# Patient Record
Sex: Female | Born: 1960 | Race: White | Hispanic: No | Marital: Married | Smoking: Current every day smoker
Health system: Southern US, Community
[De-identification: ages and names within clinical notes are randomized; demographics above are authoritative.]

## PROBLEM LIST (undated history)

## (undated) DIAGNOSIS — E7800 Pure hypercholesterolemia, unspecified: Secondary | ICD-10-CM

## (undated) DIAGNOSIS — F419 Anxiety disorder, unspecified: Secondary | ICD-10-CM

## (undated) DIAGNOSIS — I440 Atrioventricular block, first degree: Secondary | ICD-10-CM

## (undated) DIAGNOSIS — Z8619 Personal history of other infectious and parasitic diseases: Secondary | ICD-10-CM

## (undated) DIAGNOSIS — F331 Major depressive disorder, recurrent, moderate: Secondary | ICD-10-CM

## (undated) DIAGNOSIS — R002 Palpitations: Secondary | ICD-10-CM

## (undated) DIAGNOSIS — I471 Supraventricular tachycardia, unspecified: Secondary | ICD-10-CM

## (undated) DIAGNOSIS — R079 Chest pain, unspecified: Secondary | ICD-10-CM

## (undated) DIAGNOSIS — F909 Attention-deficit hyperactivity disorder, unspecified type: Secondary | ICD-10-CM

## (undated) DIAGNOSIS — I10 Essential (primary) hypertension: Secondary | ICD-10-CM

## (undated) DIAGNOSIS — R008 Other abnormalities of heart beat: Secondary | ICD-10-CM

## (undated) DIAGNOSIS — U071 COVID-19: Secondary | ICD-10-CM

## (undated) DIAGNOSIS — D518 Other vitamin B12 deficiency anemias: Secondary | ICD-10-CM

## (undated) HISTORY — DX: Other abnormalities of heart beat: R00.8

## (undated) HISTORY — DX: Palpitations: R00.2

## (undated) HISTORY — DX: Atrioventricular block, first degree: I44.0

## (undated) HISTORY — DX: Major depressive disorder, recurrent, moderate: F33.1

## (undated) HISTORY — DX: Personal history of other infectious and parasitic diseases: Z86.19

## (undated) HISTORY — DX: Attention-deficit hyperactivity disorder, unspecified type: F90.9

## (undated) HISTORY — DX: Other vitamin B12 deficiency anemias: D51.8

## (undated) HISTORY — DX: Essential (primary) hypertension: I10

## (undated) HISTORY — DX: Chest pain, unspecified: R07.9

## (undated) HISTORY — DX: Supraventricular tachycardia: I47.1

## (undated) HISTORY — PX: UPPER GASTROINTESTINAL ENDOSCOPY: SHX188

## (undated) HISTORY — DX: Pure hypercholesterolemia, unspecified: E78.00

## (undated) HISTORY — DX: Anxiety disorder, unspecified: F41.9

## (undated) HISTORY — DX: Supraventricular tachycardia, unspecified: I47.10

## (undated) HISTORY — DX: COVID-19: U07.1

---
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
## 2004-02-26 ENCOUNTER — Other Ambulatory Visit: Admission: RE | Admit: 2004-02-26 | Discharge: 2004-02-26 | Payer: Self-pay | Admitting: Gynecology

## 2004-02-26 ENCOUNTER — Ambulatory Visit (HOSPITAL_COMMUNITY): Admission: RE | Admit: 2004-02-26 | Discharge: 2004-02-26 | Payer: Self-pay | Admitting: Gynecology

## 2004-03-03 ENCOUNTER — Ambulatory Visit (HOSPITAL_COMMUNITY): Admission: RE | Admit: 2004-03-03 | Discharge: 2004-03-03 | Payer: Self-pay | Admitting: Gynecology

## 2004-10-09 IMAGING — CR DG CHEST 2V
2 series · 2 of 2 positions shown · non-contrast
Comparison: none

CLINICAL DATA: Smoking history of 20 years.  No prior exams.
PA AND LATERAL CHEST:
Heart and mediastinal contours are within normal limits.  The lung fields are notable for a 4 mm density identified between the 7th and 8th intercostal space.  A correlative abnormality is questionably seen posteriorly on the lateral film.  As the patient has no prior exams available for comparison and has a 20 year history of smoking, initial evaluation with limited thin section chest CT would be recommended.  The lung fields are otherwise clear. Bony structures appear intact.
IMPRESSION
4 mm nodular density seen in the right hemithorax and clearly identified in one view only.  Limited chest CT would be recommended for initial further assessment.  The lung fields are otherwise clear.  
Because of this finding, this report was called to Dr. Greenwald?[REDACTED].

[view not recorded (1 of 2)]
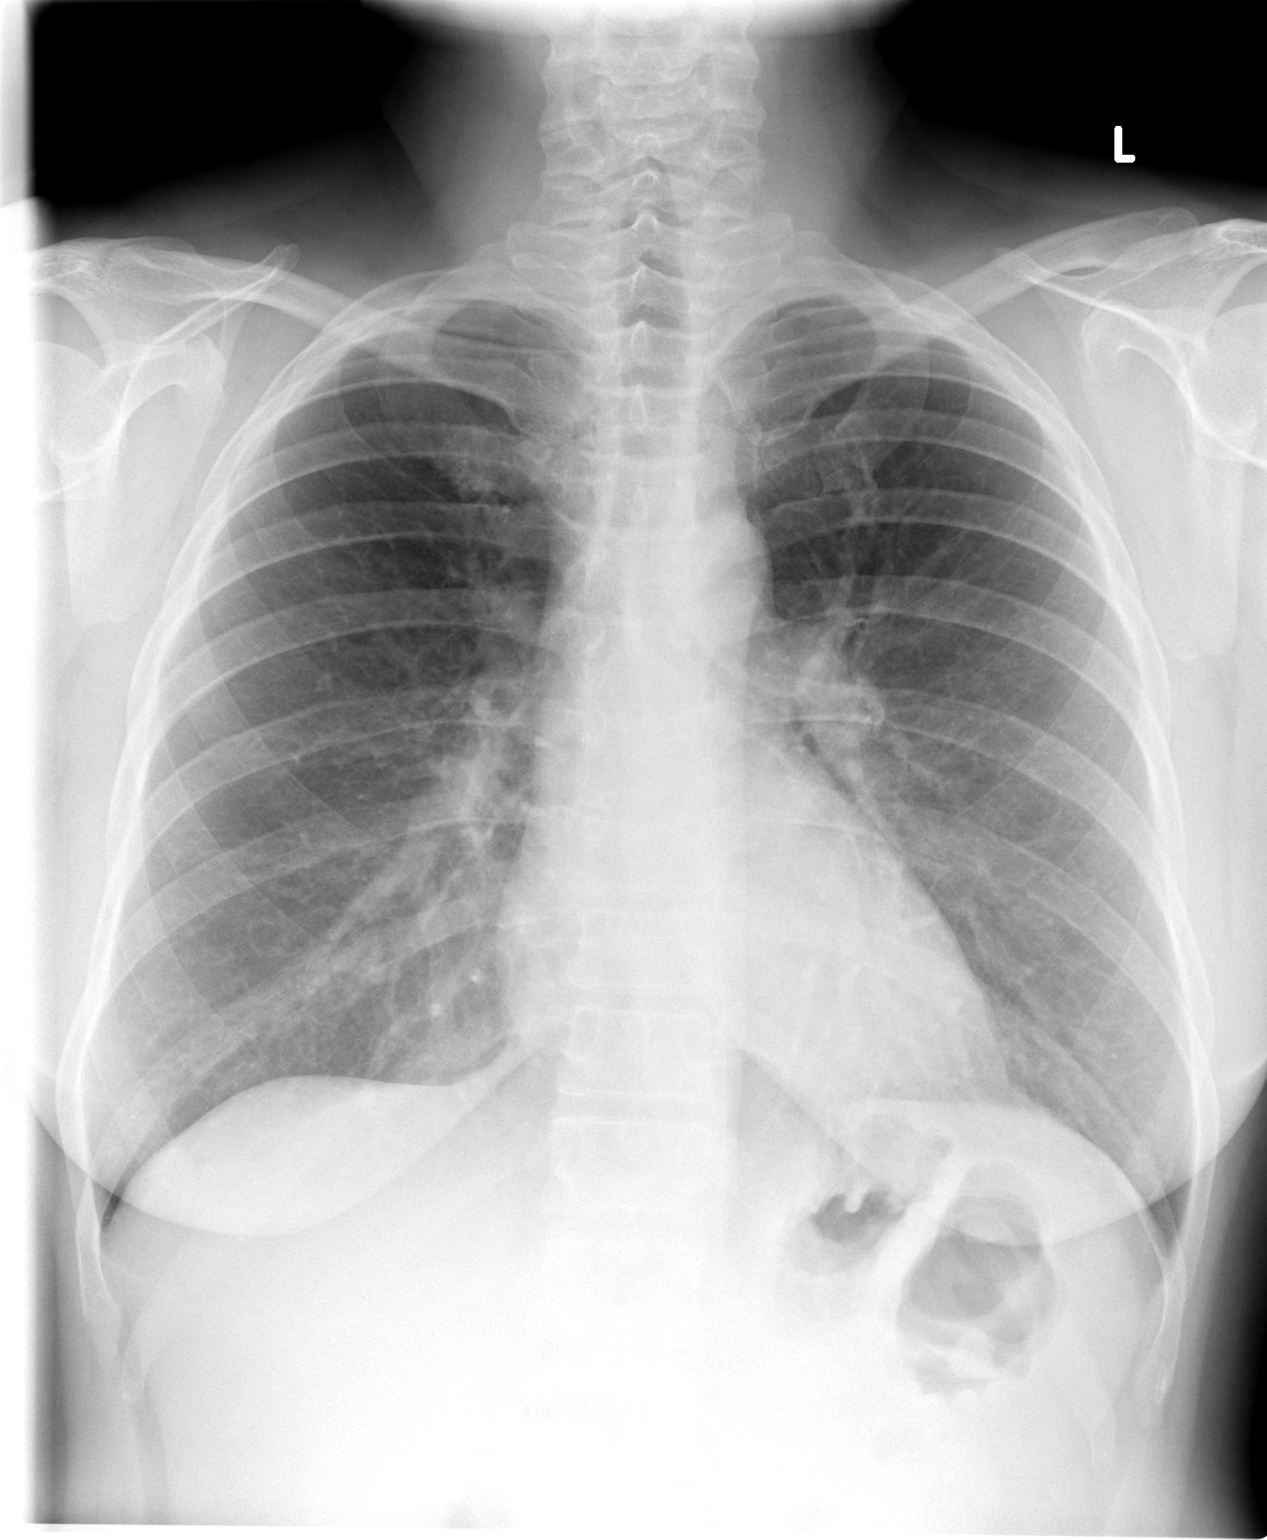

[view not recorded (2 of 2)]
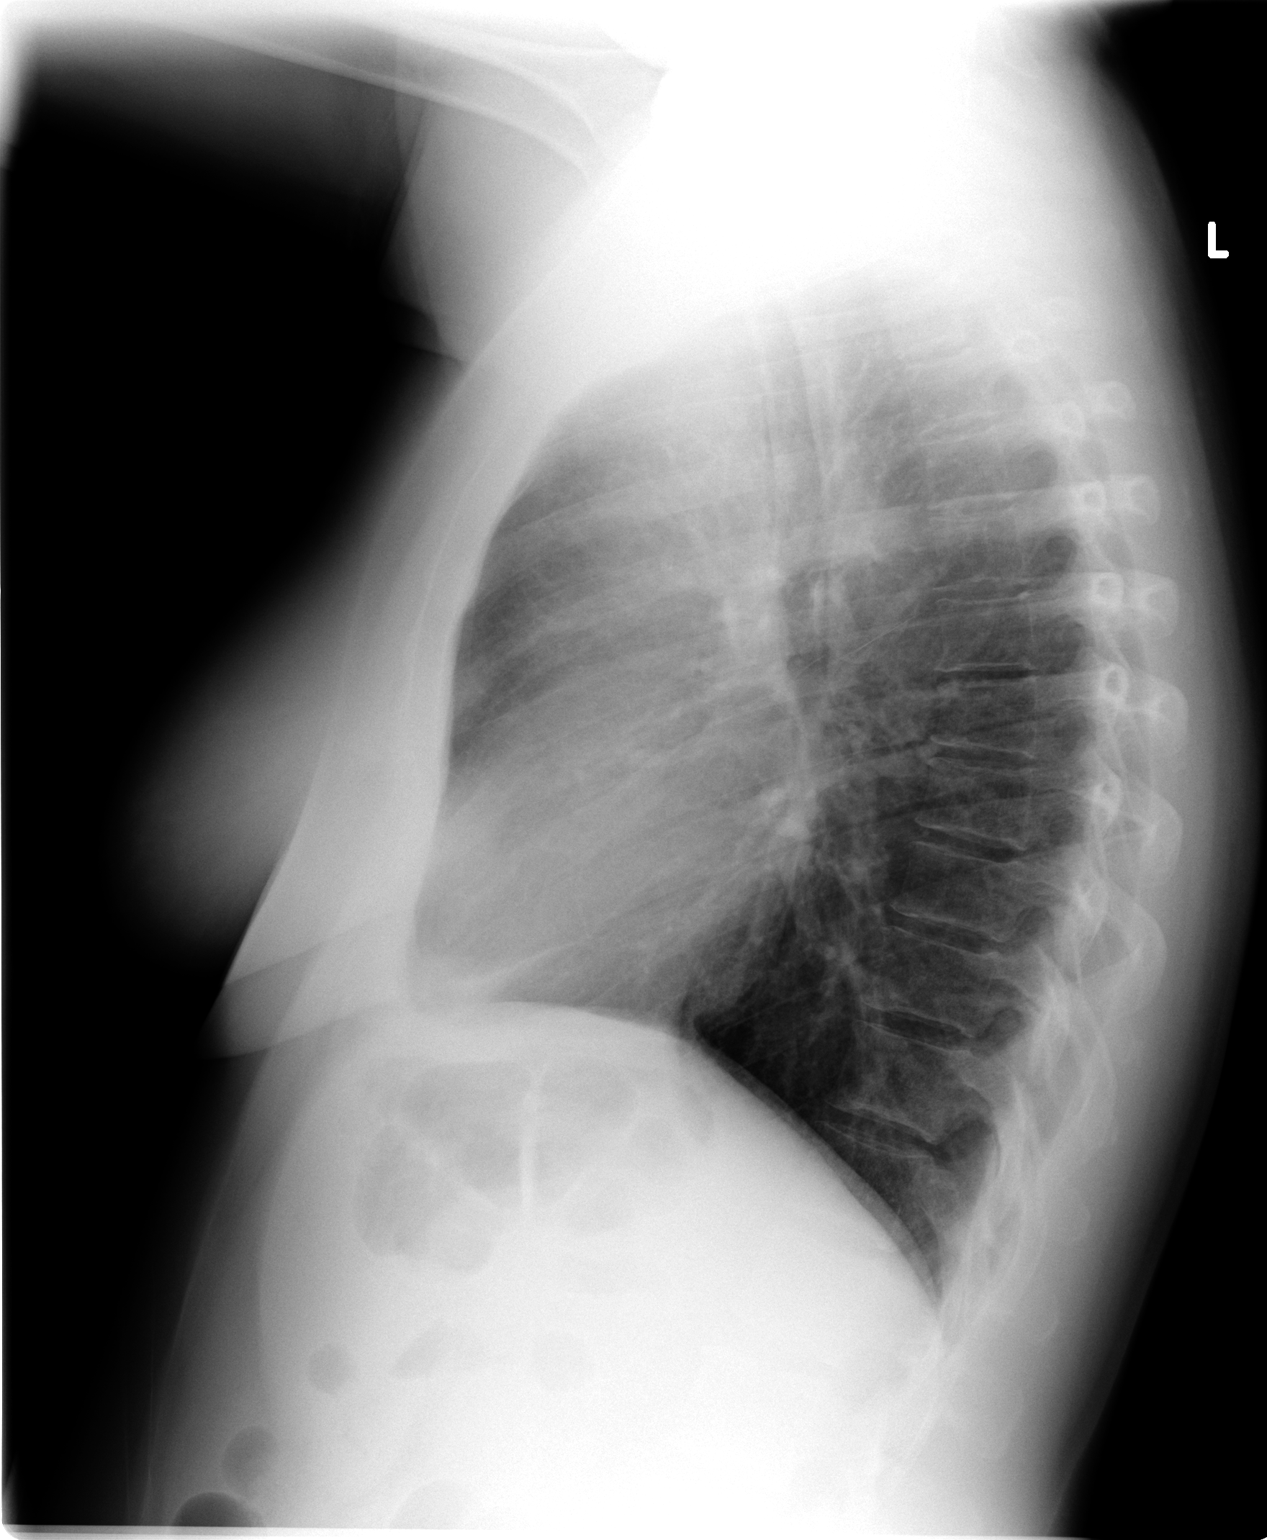

[2 of 2 positions shown; findings below may reference images not displayed]

## 2004-10-15 IMAGING — CT CT CHEST W/O CM
1 of 2 series · 14 of 31 positions shown, 18 images · IV contrast (agent unspecified)
Comparison: Two view chest x-ray from 02/26/04.

CLINICAL DATA: Nodular density seen in the right mid lung on chest x-ray.
TECHNIQUE: Contiguous 5 mm axial images were obtained through the chest without oral or IV contrast.
 CT CHEST WITHOUT CONTRAST:
 There is no evidence for axillary, mediastinal or hilar lymphadenopathy.  The heart is normal size without evidence of pericardial effusion.  There is no evidence for pleural effusion.  
 Lung windows show no parenchymal nodules or masses.  Specifically, there is no parenchymal lesion in the right lung.
 IMPRESSION
 No evidence for right lung nodule.  The changes at x-ray are felt to most likely be related to superimposition of shadows.

[Series 3: recon 2 · axial · 0.62mm/px · z∈[-246,-26]mm · 14 of 52 slices shown, 18 images]
[im 4/52  mediastinal]
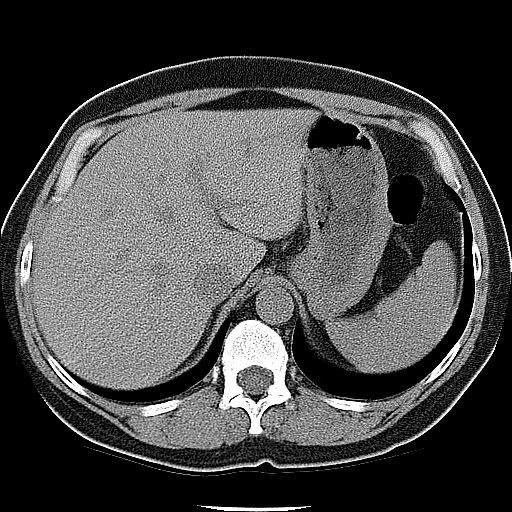
[im 4/52  lung]
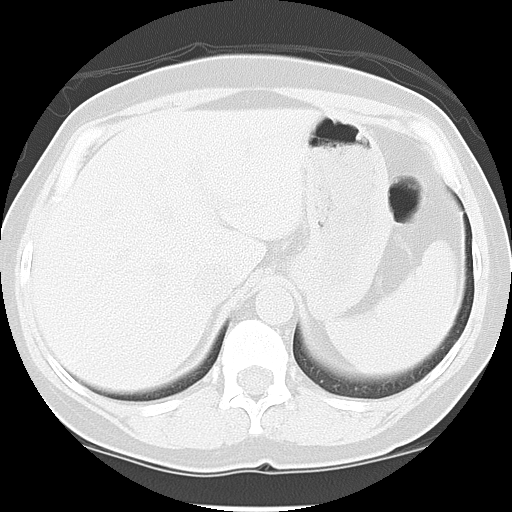
[im 8/52  lung]
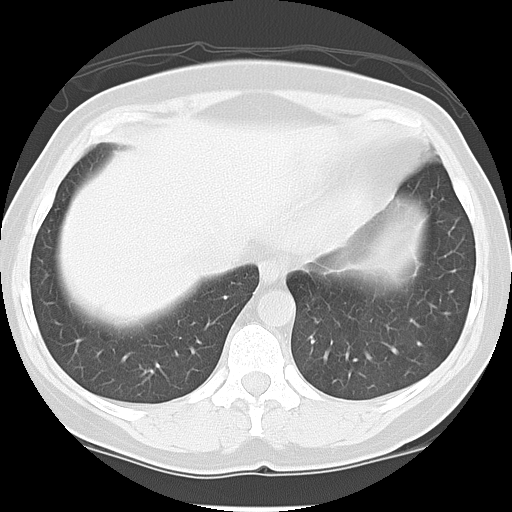
[im 12/52  lung]
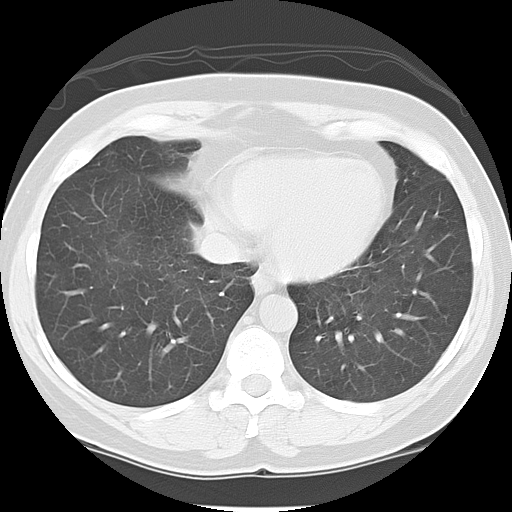
[im 16/52  lung]
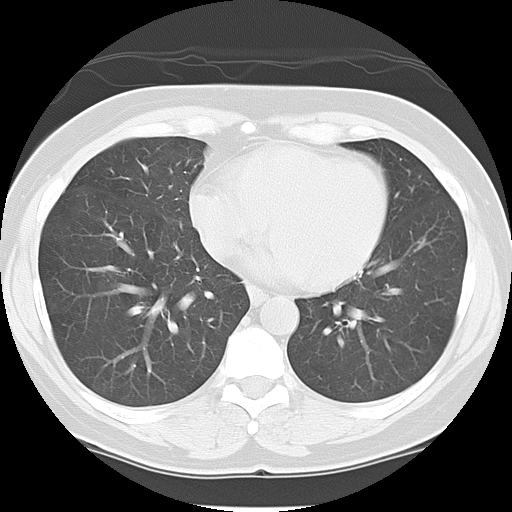
[im 20/52  mediastinal]
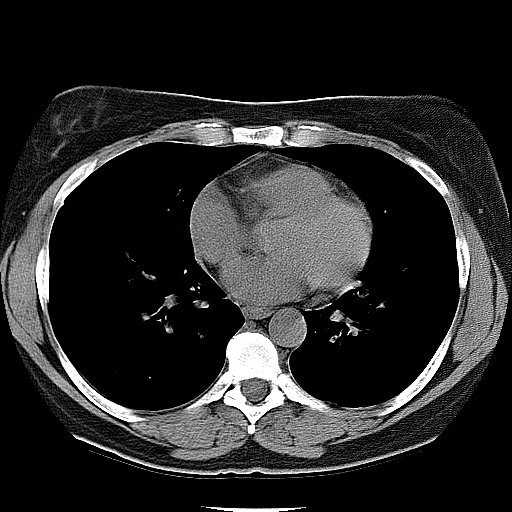
[im 20/52  lung]
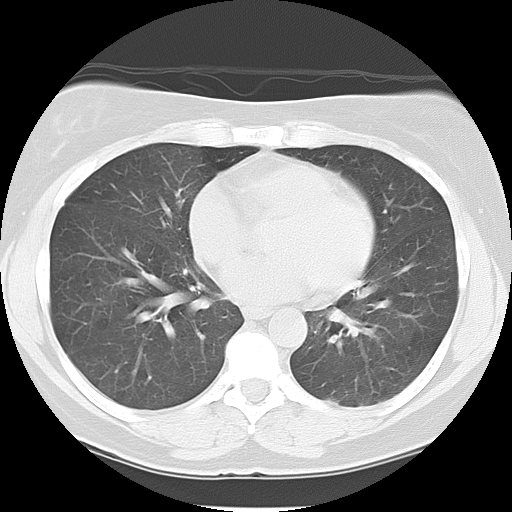
[im 24/52  lung]
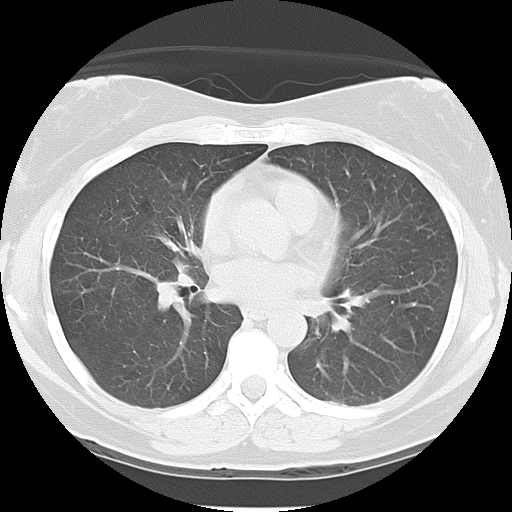
[im 25/52  lung]
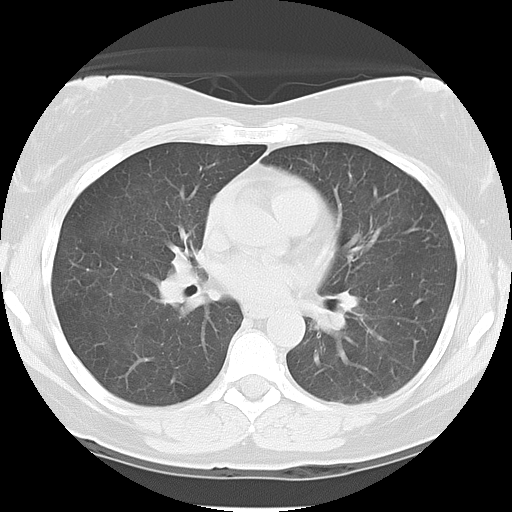
[im 26/52  lung]
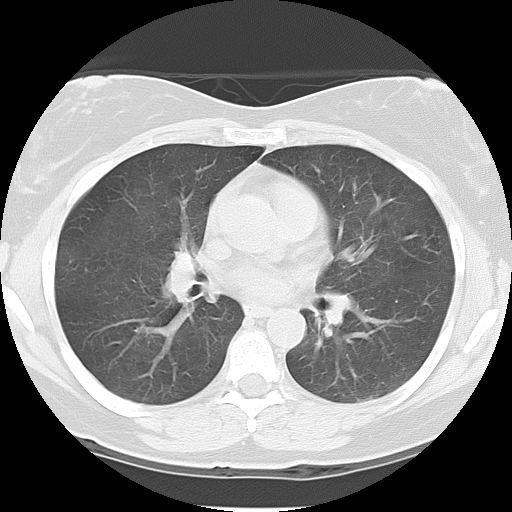
[im 28/52  mediastinal]
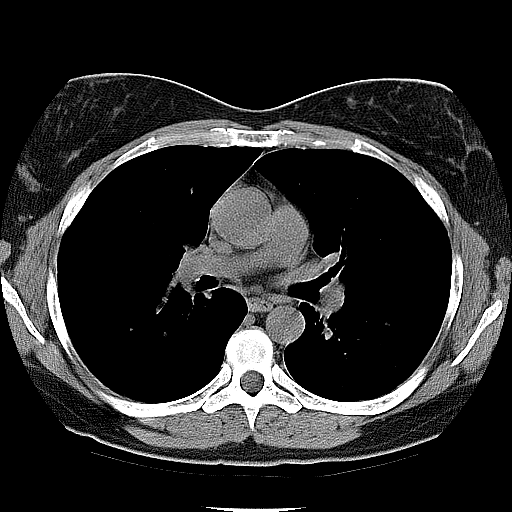
[im 28/52  lung]
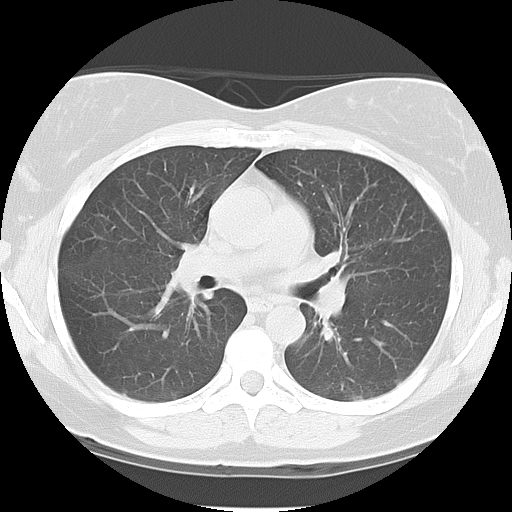
[im 32/52  lung]
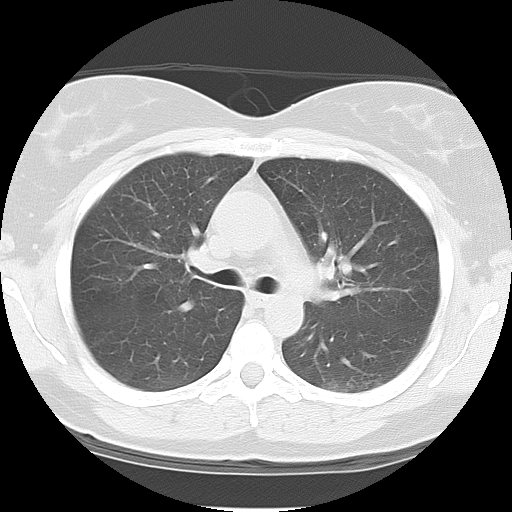
[im 36/52  lung]
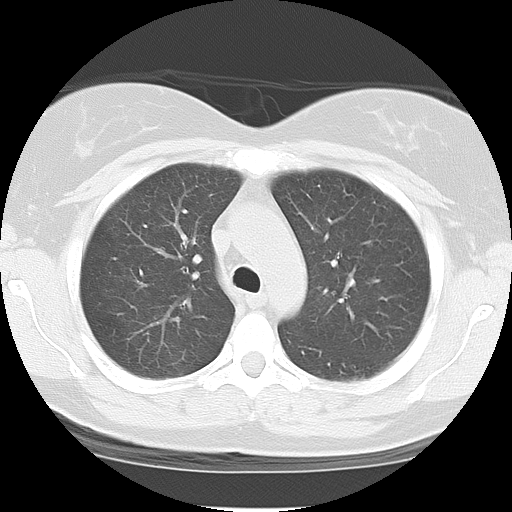
[im 40/52  lung]
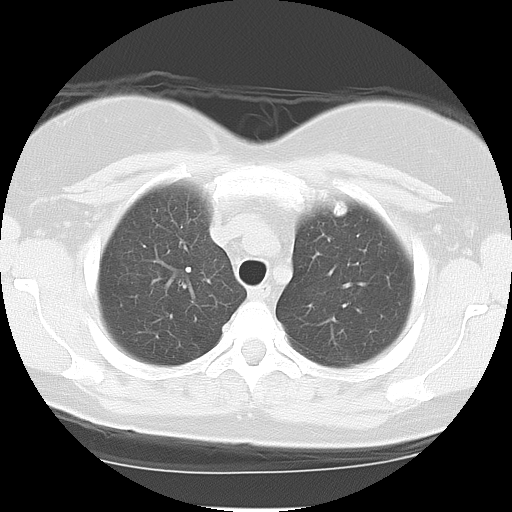
[im 44/52  mediastinal]
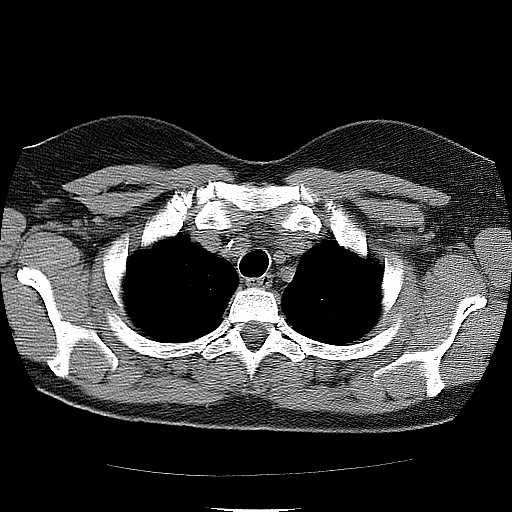
[im 44/52  lung]
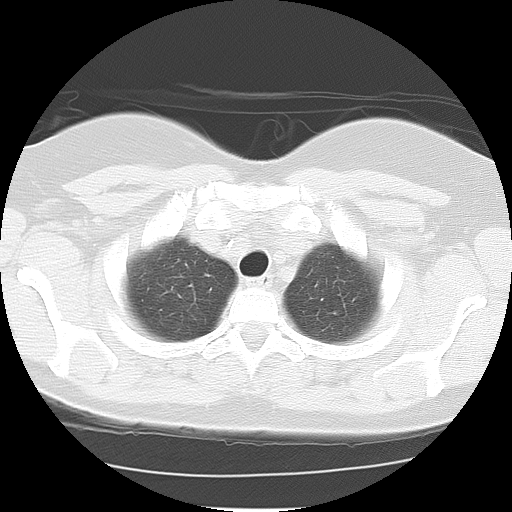
[im 48/52  lung]
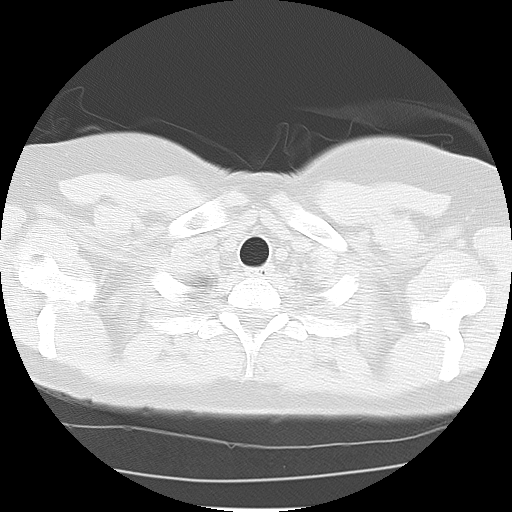

[14 of 31 positions shown; findings below may reference images not displayed]

## 2009-10-03 HISTORY — PX: CARDIAC CATHETERIZATION: SHX172

## 2012-02-08 HISTORY — PX: COLONOSCOPY: SHX174

## 2012-02-08 HISTORY — PX: ESOPHAGOGASTRODUODENOSCOPY: SHX1529

## 2018-10-15 ENCOUNTER — Ambulatory Visit: Payer: Federal, State, Local not specified - PPO | Admitting: Podiatry

## 2018-10-15 ENCOUNTER — Encounter: Payer: Self-pay | Admitting: *Deleted

## 2018-10-15 ENCOUNTER — Ambulatory Visit (INDEPENDENT_AMBULATORY_CARE_PROVIDER_SITE_OTHER): Payer: Federal, State, Local not specified - PPO

## 2018-10-15 ENCOUNTER — Encounter: Payer: Self-pay | Admitting: Podiatry

## 2018-10-15 VITALS — BP 137/90 | HR 85 | Resp 16 | Ht 62.0 in | Wt 188.0 lb

## 2018-10-15 DIAGNOSIS — M79676 Pain in unspecified toe(s): Secondary | ICD-10-CM

## 2018-10-15 DIAGNOSIS — L600 Ingrowing nail: Secondary | ICD-10-CM | POA: Diagnosis not present

## 2018-10-15 DIAGNOSIS — M898X9 Other specified disorders of bone, unspecified site: Secondary | ICD-10-CM

## 2018-10-15 MED ORDER — NEOMYCIN-POLYMYXIN-HC 3.5-10000-1 OT SOLN: OTIC | 0 refills | Status: DC

## 2018-10-15 NOTE — Progress Notes (Signed)
   Subjective:    Patient ID: Cathy Burch, female    DOB: 31-Oct-1960, 58 y.o.   MRN: [id number]  HPI    Review of Systems  All other systems reviewed and are negative.      Objective:   Physical Exam        Assessment & Plan:

## 2018-10-15 NOTE — Patient Instructions (Signed)

## 2018-10-15 NOTE — Progress Notes (Signed)
  Subjective:  Patient ID: Cathy Burch, female    DOB: 1960-10-23,  MRN: [id number]  Chief Complaint  Patient presents with  . Nail Problem    L hallux medial border pain and toenail curving x 1 year; 5/10 intermittent pain -worse with tight shoes Tx: epsom salt -pt denies N/V/F/CH/drainage/swelling -w redness    58 y.o. female presents with the above complaint.   Review of Systems: Negative except as noted in the HPI. Denies N/V/F/Ch.  History reviewed. No pertinent past medical history.  Current Outpatient Medications:  .  HYDROcodone-acetaminophen (NORCO) 10-325 MG tablet, Take 1 tablet by mouth every 6 (six) hours as needed., Disp: , Rfl:  .  neomycin-polymyxin-hydrocortisone (CORTISPORIN) OTIC solution, Apply 2-3 drops to the ingrown toenail site twice daily. Cover with band-aid., Disp: 10 mL, Rfl: 0  Social History   Tobacco Use  Smoking Status Current Every Day Smoker  . Packs/day: 0.50  . Types: Cigarettes  Smokeless Tobacco Never Used    Allergies  Allergen Reactions  . Penicillins Rash  . Sulfa Antibiotics Rash   Objective:   Vitals:   10/15/18 0919  BP: 137/90  Pulse: 85  Resp: 16   Body mass index is 34.39 kg/m. Constitutional Well developed. Well nourished.  Vascular Dorsalis pedis pulses palpable bilaterally. Posterior tibial pulses palpable bilaterally. Capillary refill normal to all digits.  No cyanosis or clubbing noted. Pedal hair growth normal.  Neurologic Normal speech. Oriented to person, place, and time. Epicritic sensation to light touch grossly present bilaterally.  Dermatologic Painful ingrowing nail at medial nail borders of the hallux nail left. No other open wounds. No skin lesions.  Orthopedic: Normal joint ROM without pain or crepitus bilaterally. No visible deformities. No bony tenderness.   Radiographs: XR taken and reviewed small subungual exostosis noted. Assessment:   1. Ingrown nail   2. Pain around toenail   3.  Subungual exostosis    Plan:  Patient was evaluated and treated and all questions answered.  Ingrown Nail, left -Patient elects to proceed with minor surgery to remove ingrown toenail removal today. Consent reviewed and signed by patient. -Ingrown nail excised. See procedure note. -Educated on post-procedure care including soaking. Written instructions provided and reviewed. -Patient to follow up in 2 weeks for nail check.  Procedure: Excision of Ingrown Toenail Location: Left 1st medial nail borders. Anesthesia: Lidocaine 1% plain; 1.5 mL and Marcaine 0.5% plain; 1.5 mL, digital block. Skin Prep: Betadine. Dressing: Silvadene; telfa; dry, sterile, compression dressing. Technique: Following skin prep, the toe was exsanguinated and a tourniquet was secured at the base of the toe. The affected nail border was freed, split with a nail splitter, and excised. Chemical matrixectomy was then performed with phenol and irrigated out with alcohol. The tourniquet was then removed and sterile dressing applied. Disposition: Patient tolerated procedure well. Patient to return in 2 weeks for follow-up.   No follow-ups on file.

## 2018-10-17 ENCOUNTER — Ambulatory Visit: Payer: Federal, State, Local not specified - PPO | Admitting: Sports Medicine

## 2018-10-22 ENCOUNTER — Other Ambulatory Visit: Payer: Self-pay | Admitting: Podiatry

## 2018-10-22 DIAGNOSIS — M79676 Pain in unspecified toe(s): Secondary | ICD-10-CM

## 2018-10-22 DIAGNOSIS — M898X9 Other specified disorders of bone, unspecified site: Secondary | ICD-10-CM

## 2018-10-22 DIAGNOSIS — L600 Ingrowing nail: Secondary | ICD-10-CM

## 2018-10-29 ENCOUNTER — Ambulatory Visit (INDEPENDENT_AMBULATORY_CARE_PROVIDER_SITE_OTHER): Payer: Federal, State, Local not specified - PPO | Admitting: Podiatry

## 2018-10-29 ENCOUNTER — Other Ambulatory Visit: Payer: Self-pay

## 2018-10-29 DIAGNOSIS — M79676 Pain in unspecified toe(s): Secondary | ICD-10-CM

## 2018-10-29 DIAGNOSIS — L600 Ingrowing nail: Secondary | ICD-10-CM

## 2018-10-29 NOTE — Progress Notes (Signed)
  Subjective:  Patient ID: Cathy Burch, female    DOB: January 30, 1961,  MRN: [id number]  No chief complaint on file.  58 y.o. female returns for the above complaint. Doing well denies drainage or swelling, redness. Pleased with results. Soaked and used drops as directed.  Objective:   General AA&O x3. Normal mood and affect.  Vascular Foot warm and well perfused with good capillary refill.  Neurologic Sensation grossly intact.  Dermatologic Nail avulsion site healing well without drainage or erythema. Nail bed with overlying soft crust. Left intact. No signs of local infection.  Orthopedic: No tenderness to palpation of the toe.   Assessment & Plan:  Patient was evaluated and treated and all questions answered.  S/p Ingrown Toenail Excision, left -Healing well without issue. -Discussed return precautions. -F/u PRN

## 2020-12-02 ENCOUNTER — Encounter: Payer: Self-pay | Admitting: Cardiology

## 2020-12-02 DIAGNOSIS — I440 Atrioventricular block, first degree: Secondary | ICD-10-CM | POA: Insufficient documentation

## 2020-12-02 DIAGNOSIS — F331 Major depressive disorder, recurrent, moderate: Secondary | ICD-10-CM | POA: Insufficient documentation

## 2020-12-02 DIAGNOSIS — F909 Attention-deficit hyperactivity disorder, unspecified type: Secondary | ICD-10-CM | POA: Insufficient documentation

## 2020-12-03 ENCOUNTER — Ambulatory Visit: Payer: Self-pay | Admitting: Cardiology

## 2020-12-28 ENCOUNTER — Ambulatory Visit (INDEPENDENT_AMBULATORY_CARE_PROVIDER_SITE_OTHER): Payer: Federal, State, Local not specified - PPO | Admitting: Cardiology

## 2020-12-28 ENCOUNTER — Other Ambulatory Visit: Payer: Self-pay

## 2020-12-28 VITALS — BP 126/80 | HR 81 | Ht 62.0 in | Wt 173.0 lb

## 2020-12-28 DIAGNOSIS — I2510 Atherosclerotic heart disease of native coronary artery without angina pectoris: Secondary | ICD-10-CM | POA: Diagnosis not present

## 2020-12-28 DIAGNOSIS — I5181 Takotsubo syndrome: Secondary | ICD-10-CM

## 2020-12-28 DIAGNOSIS — F17200 Nicotine dependence, unspecified, uncomplicated: Secondary | ICD-10-CM | POA: Diagnosis not present

## 2020-12-28 DIAGNOSIS — I440 Atrioventricular block, first degree: Secondary | ICD-10-CM

## 2020-12-28 DIAGNOSIS — J410 Simple chronic bronchitis: Secondary | ICD-10-CM

## 2020-12-28 DIAGNOSIS — J449 Chronic obstructive pulmonary disease, unspecified: Secondary | ICD-10-CM | POA: Insufficient documentation

## 2020-12-28 MED ORDER — METOPROLOL TARTRATE 50 MG PO TABS: ORAL_TABLET | ORAL | 0 refills | Status: DC

## 2020-12-28 MED ORDER — IVABRADINE HCL 5 MG PO TABS: ORAL_TABLET | ORAL | 0 refills | Status: DC

## 2020-12-28 NOTE — Patient Instructions (Addendum)
Medication Instructions:  Your physician recommends that you continue on your current medications as directed. Please refer to the Current Medication list given to you today.  *If you need a refill on your cardiac medications before your next appointment, please call your pharmacy*   Lab Work: None If you have labs (blood work) drawn today and your tests are completely normal, you will receive your results only by: Marland Kitchen MyChart Message (if you have MyChart) OR . A paper copy in the mail If you have any lab test that is abnormal or we need to change your treatment, we will call you to review the results.   Testing/Procedures: Your physician has requested that you have an echocardiogram. Echocardiography is a painless test that uses sound waves to create images of your heart. It provides your doctor with information about the size and shape of your heart and how well your heart's chambers and valves are working. This procedure takes approximately one hour. There are no restrictions for this procedure.  Your cardiac CT will be scheduled at one of the below locations:   [facility] [address] [place], [place] [address] [phone]  OR  [facility] [address] [place], [place] [address] [phone]  If scheduled at [facility], please arrive at the [facility] main entrance (entrance A) of [facility] 30 minutes prior to test start time. Proceed to the [facility] Radiology Department (first floor) to check-in and test prep.  If scheduled at [facility], please arrive 15 mins early for check-in and test prep.  Please follow these instructions carefully (unless otherwise directed):    On the Night Before the Test: . Be sure to Drink plenty of water. . Do not consume any caffeinated/decaffeinated beverages or chocolate 12 hours prior to your test. . Do not take any  antihistamines 12 hours prior to your test.   On the Day of the Test: . Drink plenty of water until 1 hour prior to the test. . Do not eat any food 4 hours prior to the test. . You may take your regular medications prior to the test.  . Take metoprolol (Lopressor) 50 mg two hours prior to test. . Take ivabradine 10 mg two hours before ct  . HOLD Hydrochlorothiazide morning of the test. . FEMALES- please wear underwire-free bra if available          After the Test: . Drink plenty of water. . After receiving IV contrast, you may experience a mild flushed feeling. This is normal. . On occasion, you may experience a mild rash up to 24 hours after the test. This is not dangerous. If this occurs, you can take Benadryl 25 mg and increase your fluid intake. . If you experience trouble breathing, this can be serious. If it is severe call 911 IMMEDIATELY. If it is mild, please call our office. . If you take any of these medications: Glipizide/Metformin, Avandament, Glucavance, please do not take 48 hours after completing test unless otherwise instructed.   Once we have confirmed authorization from your insurance company, we will call you to set up a date and time for your test. Based on how quickly your insurance processes prior authorizations requests, please allow up to 4 weeks to be contacted for scheduling your Cardiac CT appointment. Be advised that routine Cardiac CT appointments could be scheduled as many as 8 weeks after your provider has ordered it.  For non-scheduling  related questions, please contact the cardiac imaging nurse navigator should you have any questions/concerns: Rockwell Alexandria, Cardiac Imaging Nurse Navigator Larey Brick, Cardiac Imaging Nurse Navigator [facility] Direct Office Dial: [phone]   For scheduling needs, including cancellations and rescheduling, please call [place], [phone].     Follow-Up: At [facility], you  and your health needs are our priority.  As part of our continuing mission to provide you with exceptional heart care, we have created designated Provider Care Teams.  These Care Teams include your primary Cardiologist (physician) and Advanced Practice Providers (APPs -  Physician Assistants and Nurse Practitioners) who all work together to provide you with the care you need, when you need it.  We recommend signing up for the patient portal called "MyChart".  Sign up information is provided on this After Visit Summary.  MyChart is used to connect with patients for Virtual Visits (Telemedicine).  Patients are able to view lab/test results, encounter notes, upcoming appointments, etc.  Non-urgent messages can be sent to your provider as well.   To learn more about what you can do with MyChart, go to [email or web address].    Your next appointment:   2 month(s)  The format for your next appointment:   In Person  Provider:   Gypsy Balsam, MD   Other Instructions   Echocardiogram An echocardiogram is a test that uses sound waves (ultrasound) to produce images of the heart. Images from an echocardiogram can provide important information about:  Heart size and shape.  The size and thickness and movement of your heart's walls.  Heart muscle function and strength.  Heart valve function or if you have stenosis. Stenosis is when the heart valves are too narrow.  If blood is flowing backward through the heart valves (regurgitation).  A tumor or infectious growth around the heart valves.  Areas of heart muscle that are not working well because of poor blood flow or injury from a heart attack.  Aneurysm detection. An aneurysm is a weak or damaged part of an artery wall. The wall bulges out from the normal force of blood pumping through the body. Tell a health care provider about:  Any allergies you have.  All medicines you are taking, including vitamins, herbs, eye drops, creams, and  over-the-counter medicines.  Any blood disorders you have.  Any surgeries you have had.  Any medical conditions you have.  Whether you are pregnant or may be pregnant. What are the risks? Generally, this is a safe test. However, problems may occur, including an allergic reaction to dye (contrast) that may be used during the test. What happens before the test? No specific preparation is needed. You may eat and drink normally. What happens during the test?  You will take off your clothes from the waist up and put on a hospital gown.  Electrodes or electrocardiogram (ECG)patches may be placed on your chest. The electrodes or patches are then connected to a device that monitors your heart rate and rhythm.  You will lie down on a table for an ultrasound exam. A gel will be applied to your chest to help sound waves pass through your skin.  A handheld device, called a transducer, will be pressed against your chest and moved over your heart. The transducer produces sound waves that travel to your heart and bounce back (or "echo" back) to the transducer. These sound waves will be captured in real-time and changed into images of your heart that can be viewed  on a video monitor. The images will be recorded on a computer and reviewed by your health care provider.  You may be asked to change positions or hold your breath for a short time. This makes it easier to get different views or better views of your heart.  In some cases, you may receive contrast through an IV in one of your veins. This can improve the quality of the pictures from your heart. The procedure may vary among health care providers and hospitals.   What can I expect after the test? You may return to your normal, everyday life, including diet, activities, and medicines, unless your health care provider tells you not to do that. Follow these instructions at home:  It is up to you to get the results of your test. Ask your health care  provider, or the department that is doing the test, when your results will be ready.  Keep all follow-up visits. This is important. Summary  An echocardiogram is a test that uses sound waves (ultrasound) to produce images of the heart.  Images from an echocardiogram can provide important information about the size and shape of your heart, heart muscle function, heart valve function, and other possible heart problems.  You do not need to do anything to prepare before this test. You may eat and drink normally.  After the echocardiogram is completed, you may return to your normal, everyday life, unless your health care provider tells you not to do that. This information is not intended to replace advice given to you by your health care provider. Make sure you discuss any questions you have with your health care provider. Document Revised: 05/12/2020 Document Reviewed: 05/12/2020 Elsevier Patient Education  2021 [employer].

## 2020-12-28 NOTE — Progress Notes (Signed)
Cardiology Consultation:    Date:  12/28/2020   ID:  Cathy Burch, DOB [birth year], MRN [id number]  PCP:  Galvin Proffer, MD  Cardiologist:  Gypsy Balsam, MD   Referring MD: Joaquin Music, NP   Chief Complaint  Patient presents with  . Atrioventricular Block 1rst degree    History of Present Illness:    Cathy Burch is a 60 y.o. female who is being seen today for the evaluation of first-degree AV block at the request of Joaquin Music, NP.  We had a pleasure to evaluate her in 2017.  She presented to the hospital with chest pain she was found to have 20 to 30% blockages on cardiac catheterization at the time.  Her past medical history is also significant for chronic smoking which is still ongoing, depression, COPD.  Year ago she lost her daughter of 28 years.  Her daughter got lupus and eventually in the passing.  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  Since that time she is being experiencing some chest pain.  She described pain mostly in the left side like a nagging sensation up to 6 in scale up to 10.  Taking few deep breath usually help with the pain.  It usually happen in stressful situations not while walking or exercising.  There is no shortness of breath there is no sweating associated with this sensation.  He was told this is a manifestation of her broken heart syndrome.  On top of that she was diagnosed to have first-degree AV block and she was also told that this is related to stress related cardiomyopathy.  She described to have some exertional shortness of breath.  Sometimes does have some swelling of lower extremities but no proximal nocturnal dyspnea.  She still continues to smoke about 1 pack/day.  She does have multiple family members with premature coronary artery disease.  She does have dyslipidemia for which she takes Crestor 20.  Past  Medical History:  Diagnosis Date  . ADHD   . Atrioventricular block, first degree   . Chest pain   . COVID-19 virus detected   . Major depressive disorder, recurrent, moderate (HCC)   . Other abnormalities of heart beat   . Other vitamin B12 deficiency anemias   . Palpitations   . Supraventricular tachycardia [facility])     Past Surgical History:  Procedure Laterality Date  . CESAREAN SECTION      Current Medications: Current Meds  Medication Sig  . albuterol (VENTOLIN HFA) 108 (90 Base) MCG/ACT inhaler Inhale 1 puff into the lungs every 6 (six) hours as needed for wheezing or shortness of breath.  . benzonatate (TESSALON) 100 MG capsule Take 100 mg by mouth 3 (three) times daily as needed for cough.  Marland Kitchen buPROPion (WELLBUTRIN SR) 150 MG 12 hr tablet Take 150 mg by mouth daily.  . hydrochlorothiazide (HYDRODIURIL) 12.5 MG tablet Take 12.5 mg by mouth daily.  . montelukast (SINGULAIR) 10 MG tablet Take 10 mg by mouth at bedtime.  . ondansetron (ZOFRAN) 4 MG tablet Take 4 mg by mouth every 8 (eight) hours as needed for nausea or vomiting.  . rosuvastatin (CRESTOR) 20 MG tablet Take 20 mg by mouth daily.  . Vitamin D, Ergocalciferol, (DRISDOL) 1.25 MG (50000 UNIT) CAPS capsule Take 50,000 Units by mouth every 7 (seven) days. Then 1000mg  additional every day  . [  DISCONTINUED] atorvastatin (LIPITOR) 80 MG tablet Take 80 mg by mouth daily.     Allergies:   Penicillins and Sulfa antibiotics   Social History   Socioeconomic History  . Marital status: Married    Spouse name: Not on file  . Number of children: Not on file  . Years of education: Not on file  . Highest education level: Not on file  Occupational History  . Not on file  Tobacco Use  . Smoking status: Current Every Day Smoker    Packs/day: 0.50    Types: Cigarettes  . Smokeless tobacco: Never Used  Substance and Sexual Activity  . Alcohol use: Not on file  . Drug use: Not on file  . Sexual activity: Not on file  Other  Topics Concern  . Not on file  Social History Narrative  . Not on file   Social Determinants of Health   Financial Resource Strain: Not on file  Food Insecurity: Not on file  Transportation Needs: Not on file  Physical Activity: Not on file  Stress: Not on file  Social Connections: Not on file     Family History: The patient's family history includes COPD in her father; Fibromyalgia in her father. ROS:   Please see the history of present illness.    All 14 point review of systems negative except as described per history of present illness.  EKGs/Labs/Other Studies Reviewed:    The following studies were reviewed today:   EKG:  EKG is  ordered today.  The ekg ordered today demonstrates normal sinus rhythm, normal P interval normal QS complex duration morphology nonspecific ST segment changes  Recent Labs: No results found for requested labs within last 8760 hours.  Recent Lipid Panel No results found for: CHOL, TRIG, HDL, CHOLHDL, VLDL, LDLCALC, LDLDIRECT  Physical Exam:    VS:  BP 126/80 (BP Location: Left Arm, Patient Position: Sitting)   Pulse 81   Ht 5\' 2"  (1.575 m)   Wt 173 lb (78.5 kg)   SpO2 93%   BMI 31.64 kg/m     Wt Readings from Last 3 Encounters:  12/28/20 173 lb (78.5 kg)  10/15/18 188 lb (85.3 kg)     GEN:  Well nourished, well developed in no acute distress HEENT: Normal NECK: No JVD; No carotid bruits LYMPHATICS: No lymphadenopathy CARDIAC: RRR, no murmurs, no rubs, no gallops RESPIRATORY:  Clear to auscultation without rales, wheezing or rhonchi  ABDOMEN: Soft, non-tender, non-distended MUSCULOSKELETAL:  No edema; No deformity  SKIN: Warm and dry NEUROLOGIC:  Alert and oriented x 3 PSYCHIATRIC:  Normal affect   ASSESSMENT:    1. Atrioventricular block, first degree   2. Coronary artery disease involving native coronary artery of native heart without angina pectoris   3. Simple chronic bronchitis (HCC)   4. Smoking   5. Takotsubo  cardiomyopathy    PLAN:    In order of problems listed above:  1. First-degree AV block I have EKG today showed normal PR intervals with normal AV conduction, will avoid any AV blocking agent.  First-degree AV block does not have long-term consequences. 2. Coronary disease with cardiac catheterization 2017 showing 20 to 30% blockages.  She does have some symptomatology suggesting activation of coronary artery disease.  I will schedule her to have coronary CT angio to clarify that. 3. Question about Takotsubo cardiomyopathy.  I will schedule her to have echocardiogram to assess left ventricle ejection fraction. 4. Smoking obviously significant problem I strongly recommend to quit  she understands she will try to work on it. 5. COPD stable. 6. She is taking aspirin every single day which I recommend to continue, she also got nitroglycerin as needed asked her to take it when she needed.   Medication Adjustments/Labs and Tests Ordered: Current medicines are reviewed at length with the patient today.  Concerns regarding medicines are outlined above.  No orders of the defined types were placed in this encounter.  No orders of the defined types were placed in this encounter.   Signed, Georgeanna Lea, MD, [facility] 12/28/2020 11:09 AM    [facility]

## 2020-12-28 NOTE — Addendum Note (Signed)
Addended by: Hazle Quant on: 12/28/2020 11:33 AM   Modules accepted: Orders

## 2021-01-13 NOTE — Addendum Note (Signed)
Addended by: Heywood Bene on: 01/13/2021 04:48 PM   Modules accepted: Orders

## 2021-01-18 ENCOUNTER — Other Ambulatory Visit: Payer: Federal, State, Local not specified - PPO

## 2021-04-01 DIAGNOSIS — D518 Other vitamin B12 deficiency anemias: Secondary | ICD-10-CM | POA: Insufficient documentation

## 2021-04-01 DIAGNOSIS — I471 Supraventricular tachycardia, unspecified: Secondary | ICD-10-CM | POA: Insufficient documentation

## 2021-04-01 DIAGNOSIS — R079 Chest pain, unspecified: Secondary | ICD-10-CM | POA: Insufficient documentation

## 2021-04-01 DIAGNOSIS — U071 COVID-19: Secondary | ICD-10-CM | POA: Insufficient documentation

## 2021-04-01 DIAGNOSIS — R002 Palpitations: Secondary | ICD-10-CM | POA: Insufficient documentation

## 2021-04-01 DIAGNOSIS — R008 Other abnormalities of heart beat: Secondary | ICD-10-CM | POA: Insufficient documentation

## 2021-04-02 ENCOUNTER — Ambulatory Visit: Payer: Federal, State, Local not specified - PPO | Admitting: Cardiology

## 2022-08-12 ENCOUNTER — Ambulatory Visit: Payer: Federal, State, Local not specified - PPO | Admitting: Gastroenterology

## 2023-05-04 ENCOUNTER — Encounter: Payer: Self-pay | Admitting: Gastroenterology

## 2023-05-04 ENCOUNTER — Ambulatory Visit (INDEPENDENT_AMBULATORY_CARE_PROVIDER_SITE_OTHER): Payer: Federal, State, Local not specified - PPO | Admitting: Gastroenterology

## 2023-05-04 VITALS — BP 158/92 | HR 72 | Ht 62.0 in | Wt 185.2 lb

## 2023-05-04 DIAGNOSIS — K589 Irritable bowel syndrome without diarrhea: Secondary | ICD-10-CM

## 2023-05-04 DIAGNOSIS — Z1212 Encounter for screening for malignant neoplasm of rectum: Secondary | ICD-10-CM

## 2023-05-04 DIAGNOSIS — Z1211 Encounter for screening for malignant neoplasm of colon: Secondary | ICD-10-CM | POA: Diagnosis not present

## 2023-05-04 NOTE — Progress Notes (Signed)
Chief Complaint: For colonoscopy  Referring Provider:  Galvin Proffer, MD      ASSESSMENT AND PLAN;   #1. CRC screening  #2. IBS with alternating diarrhea and constipation   Plan: -Colon with 2 day prep. -Encouraged her to quit smoking.   Discussed risks & benefits of colonoscopy. Risks including rare perforation req laparotomy, bleeding after bx/polypectomy req blood transfusion, rarely missing neoplasms, risks of anesthesia/sedation, rare risk of damage to internal organs. Benefits outweigh the risks. Patient agrees to proceed. All the questions were answered. Pt consents to proceed.  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 No melena or hematochezia. No unintentional weight loss. No abdominal pain.  Alt diarrhea and constipation- Dx with IBS with bloating in past  Had blood in the stool- 2 weeks ago- not now.  Was mostly red in color.  Attributed to hemorrhoids.     Past GI workup:  EGD 02/2012: -Moderate gastroduodenitis -Negative CLO, negative small bowel biopsies for celiac  Colonoscopy 02/08/2012 ([facility]-highly redundant) -Fair prep -Colon polyp s/p polypectomy. 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  Other 1 having lots of problems.  Patient is taking care of grandkids    Past Medical History:  Diagnosis Date   ADHD    Anxiety    Atrioventricular block, first degree    Chest pain    COVID-19 virus detected    History of Helicobacter pylori infection    Hypercholesterolemia    Major depressive disorder, recurrent, moderate (HCC)    Other abnormalities of heart beat    Other vitamin B12 deficiency  anemias    Palpitations    Supraventricular tachycardia     Past Surgical History:  Procedure Laterality Date   CARDIAC CATHETERIZATION  2011   CESAREAN SECTION  1995   COLONOSCOPY  02/08/2012   Colonic polyps status post polypectomy. Mild pancolonic diverticulosis. Small internal hemorrhoids. Otherwise grossly normal colonoscopy. The examination was limited due to quality of preperation   ESOPHAGOGASTRODUODENOSCOPY  02/08/2012   Moderate gastroduodenitis. Otherwise normal EGD    Family History  Problem Relation Age of Onset   Fibromyalgia Father    COPD Father    Colon cancer Maternal Uncle    Rectal cancer Neg Hx    Stomach cancer Neg Hx    Esophageal cancer Neg Hx    Pancreatic cancer Neg Hx    Liver cancer Neg Hx     Social History   Tobacco Use   Smoking status: Every Day    Types: Cigarettes   Smokeless tobacco: Never   Tobacco comments:    Smokes 1 pack a week  Vaping Use   Vaping status: Never Used  Substance Use Topics   Alcohol use: Never   Drug use: Never    Current Outpatient Medications  Medication Sig Dispense Refill   albuterol (VENTOLIN HFA) 108 (90 Base) MCG/ACT inhaler Inhale 1 puff into the lungs every 6 (six) hours as needed for wheezing or shortness of breath.     benzonatate (TESSALON) 100 MG capsule Take 100 mg by mouth 3 (three) times daily as needed for cough.     buPROPion (WELLBUTRIN SR) 150 MG 12  hr tablet Take 150 mg by mouth daily.     hydroxychloroquine (PLAQUENIL) 200 MG tablet Take 200 mg by mouth daily.     montelukast (SINGULAIR) 10 MG tablet Take 10 mg by mouth at bedtime.     nebivolol (BYSTOLIC) 5 MG tablet 5 mg daily.     ondansetron (ZOFRAN) 4 MG tablet Take 4 mg by mouth every 8 (eight) hours as needed for nausea or vomiting.     rosuvastatin (CRESTOR) 5 MG tablet Take 5 mg by mouth daily.     Vitamin D, Ergocalciferol, (DRISDOL) 1.25 MG (50000 UNIT) CAPS capsule Take 50,000 Units by mouth every 7 (seven) days. Then 1000mg   additional every day     No current facility-administered medications for this visit.    Allergies  Allergen Reactions   Penicillins Rash   Sulfa Antibiotics Rash    Review of Systems:  Constitutional: Denies fever, chills, diaphoresis, appetite change and fatigue.  HEENT: Denies photophobia, eye pain, redness, hearing loss, ear pain, congestion, sore throat, rhinorrhea, sneezing, mouth sores, neck pain, neck stiffness and tinnitus.   Respiratory: Denies SOB, DOE, cough, chest tightness,  and wheezing.  Has mild COPD Cardiovascular: Denies chest pain, palpitations and leg swelling.  Genitourinary: Denies dysuria, urgency, frequency, hematuria, flank pain and difficulty urinating.  Musculoskeletal: Has fibromyalgia Skin: No rash.  Neurological: Denies dizziness, seizures, syncope, weakness, light-headedness, numbness and headaches.  Hematological: Denies adenopathy. Easy bruising, personal or family bleeding history  Psychiatric/Behavioral: Has anxiety or depression     Physical Exam:    BP (!) 158/92   Pulse 72   Ht 5\' 2"  (1.575 m)   Wt 185 lb 4 oz (84 kg)   SpO2 95%   BMI 33.88 kg/m  Wt Readings from Last 3 Encounters:  05/04/23 185 lb 4 oz (84 kg)  12/28/20 173 lb (78.5 kg)  10/15/18 188 lb (85.3 kg)   Constitutional:  Well-developed, in no acute distress. Psychiatric: Normal mood and affect. Behavior is normal. HEENT: Pupils normal.  Conjunctivae are normal. No scleral icterus. Cardiovascular: Normal rate, regular rhythm. No edema Pulmonary/chest: Effort normal and breath sounds-decreased.  No wheezing, rales or rhonchi. Abdominal: Soft, nondistended. Nontender. Bowel sounds active throughout. There are no masses palpable. No hepatomegaly. Rectal: Deferred Neurological: Alert and oriented to person place and time. Skin: Skin is warm and dry. No rashes noted.      Edman Circle, MD 05/04/2023, 9:39 AM  Cc: Galvin Proffer, MD

## 2023-05-04 NOTE — Patient Instructions (Signed)
_______________________________________________________  If your blood pressure at your visit was 140/90 or greater, please contact your primary care physician to follow up on this.  _______________________________________________________  If you are age 62 or older, your body mass index should be between 23-30. Your Body mass index is 33.88 kg/m. If this is out of the aforementioned range listed, please consider follow up with your Primary Care Provider.  If you are age 7 or younger, your body mass index should be between 19-25. Your Body mass index is 33.88 kg/m. If this is out of the aformentioned range listed, please consider follow up with your Primary Care Provider.   ________________________________________________________  The  GI providers would like to encourage you to use [facility] to communicate with providers for non-urgent requests or questions.  Due to long hold times on the telephone, sending your provider a message by [facility] may be a faster and more efficient way to get a response.  Please allow 48 business hours for a response.  Please remember that this is for non-urgent requests.  _______________________________________________________  Two days before your procedure: Mix 3 packs (or capfuls) of Miralax in 48 ounces of clear liquid and drink at 6pm.  You have been scheduled for a colonoscopy. Please follow written instructions given to you at your visit today.   Please pick up your prep supplies at the pharmacy within the next 1-3 days.  If you use inhalers (even only as needed), please bring them with you on the day of your procedure.  DO NOT TAKE 7 DAYS PRIOR TO TEST- Trulicity (dulaglutide) Ozempic, Wegovy (semaglutide) Mounjaro (tirzepatide) Bydureon Bcise (exanatide extended release)  DO NOT TAKE 1 DAY PRIOR TO YOUR TEST Rybelsus (semaglutide) Adlyxin (lixisenatide) Victoza (liraglutide) Byetta  (exanatide) ___________________________________________________________________________  Thank you,  Dr. Lynann Bologna

## 2023-06-21 ENCOUNTER — Encounter: Payer: Self-pay | Admitting: Gastroenterology

## 2023-07-05 ENCOUNTER — Ambulatory Visit: Payer: Federal, State, Local not specified - PPO | Admitting: Gastroenterology

## 2023-07-05 ENCOUNTER — Encounter: Payer: Self-pay | Admitting: Gastroenterology

## 2023-07-05 VITALS — BP 138/64 | HR 77 | Temp 97.3°F | Resp 18 | Ht 62.0 in | Wt 188.0 lb

## 2023-07-05 DIAGNOSIS — K621 Rectal polyp: Secondary | ICD-10-CM | POA: Diagnosis not present

## 2023-07-05 DIAGNOSIS — K589 Irritable bowel syndrome without diarrhea: Secondary | ICD-10-CM

## 2023-07-05 DIAGNOSIS — Z1211 Encounter for screening for malignant neoplasm of colon: Secondary | ICD-10-CM

## 2023-07-05 DIAGNOSIS — D120 Benign neoplasm of cecum: Secondary | ICD-10-CM

## 2023-07-05 DIAGNOSIS — D128 Benign neoplasm of rectum: Secondary | ICD-10-CM

## 2023-07-05 MED ORDER — SODIUM CHLORIDE 0.9 % IV SOLN: 500.0000 mL | Freq: Once | INTRAVENOUS | Status: DC

## 2023-07-05 NOTE — Progress Notes (Signed)
Cell phone off per pt  

## 2023-07-05 NOTE — Progress Notes (Signed)
Chief Complaint: For colonoscopy  Referring Provider:  Galvin Proffer, MD      ASSESSMENT AND PLAN;   #1. CRC screening  #2. IBS with alternating diarrhea and constipation   Plan: -Colon with 2 day prep. -Encouraged her to quit smoking.   Discussed risks & benefits of colonoscopy. Risks including rare perforation req laparotomy, bleeding after bx/polypectomy req blood transfusion, rarely missing neoplasms, risks of anesthesia/sedation, rare risk of damage to internal organs. Benefits outweigh the risks. Patient agrees to proceed. All the questions were answered. Pt consents to proceed.  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 No melena or hematochezia. No unintentional weight loss. No abdominal pain.  Alt diarrhea and constipation- Dx with IBS with bloating in past  Had blood in the stool- 2 weeks ago- not now.  Was mostly red in color.  Attributed to hemorrhoids.     Past GI workup:  EGD 02/2012: -Moderate gastroduodenitis -Negative CLO, negative small bowel biopsies for celiac  Colonoscopy 02/08/2012 ([facility]-highly redundant) -Fair prep -Colon polyp s/p polypectomy. 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  Other 1 having lots of problems.  Patient is taking care of grandkids    Past Medical History:  Diagnosis Date   ADHD    Anxiety    Atrioventricular block, first degree    Chest pain    COVID-19 virus detected    History of Helicobacter pylori infection    Hypercholesterolemia    Hypertension    Major depressive disorder, recurrent, moderate (HCC)    Other abnormalities of heart beat    Other vitamin  B12 deficiency anemias    Palpitations    Supraventricular tachycardia [facility])     Past Surgical History:  Procedure Laterality Date   CARDIAC CATHETERIZATION  2011   CESAREAN SECTION  1995   COLONOSCOPY  02/08/2012   Colonic polyps status post polypectomy. Mild pancolonic diverticulosis. Small internal hemorrhoids. Otherwise grossly normal colonoscopy. The examination was limited due to quality of preperation   ESOPHAGOGASTRODUODENOSCOPY  02/08/2012   Moderate gastroduodenitis. Otherwise normal EGD   UPPER GASTROINTESTINAL ENDOSCOPY      Family History  Problem Relation Age of Onset   Fibromyalgia Father    COPD Father    Colon cancer Maternal Uncle    Rectal cancer Neg Hx    Stomach cancer Neg Hx    Esophageal cancer Neg Hx    Pancreatic cancer Neg Hx    Liver cancer Neg Hx     Social History   Tobacco Use   Smoking status: Every Day    Current packs/day: 0.25    Types: Cigarettes   Smokeless tobacco: Never   Tobacco comments:    Smokes 1 pack a week  Vaping Use   Vaping status: Never Used  Substance Use Topics   Alcohol use: Never   Drug use: Never    Current Outpatient Medications  Medication Sig Dispense Refill   albuterol (VENTOLIN HFA) 108 (90 Base) MCG/ACT inhaler Inhale 1 puff into the lungs every 6 (six) hours as needed for wheezing or shortness of breath.     hydroxychloroquine (PLAQUENIL) 200 MG tablet Take 200 mg by mouth  daily.     nebivolol (BYSTOLIC) 5 MG tablet 5 mg daily.     rosuvastatin (CRESTOR) 5 MG tablet Take 5 mg by mouth daily.     Vitamin D, Ergocalciferol, (DRISDOL) 1.25 MG (50000 UNIT) CAPS capsule Take 50,000 Units by mouth every 7 (seven) days. Then 1000mg  additional every day     benzonatate (TESSALON) 100 MG capsule Take 100 mg by mouth 3 (three) times daily as needed for cough.     buPROPion (WELLBUTRIN SR) 150 MG 12 hr tablet Take 150 mg by mouth daily.     montelukast (SINGULAIR) 10 MG tablet Take 10 mg by mouth at bedtime.      ondansetron (ZOFRAN) 4 MG tablet Take 4 mg by mouth every 8 (eight) hours as needed for nausea or vomiting.     Current Facility-Administered Medications  Medication Dose Route Frequency Provider Last Rate Last Admin   0.9 %  sodium chloride infusion  500 mL Intravenous Once Lynann Bologna, MD        Allergies  Allergen Reactions   Penicillins Rash   Sulfa Antibiotics Rash    Review of Systems:  Constitutional: Denies fever, chills, diaphoresis, appetite change and fatigue.  HEENT: Denies photophobia, eye pain, redness, hearing loss, ear pain, congestion, sore throat, rhinorrhea, sneezing, mouth sores, neck pain, neck stiffness and tinnitus.   Respiratory: Denies SOB, DOE, cough, chest tightness,  and wheezing.  Has mild COPD Cardiovascular: Denies chest pain, palpitations and leg swelling.  Genitourinary: Denies dysuria, urgency, frequency, hematuria, flank pain and difficulty urinating.  Musculoskeletal: Has fibromyalgia Skin: No rash.  Neurological: Denies dizziness, seizures, syncope, weakness, light-headedness, numbness and headaches.  Hematological: Denies adenopathy. Easy bruising, personal or family bleeding history  Psychiatric/Behavioral: Has anxiety or depression     Physical Exam:    BP (!) 165/82   Pulse 72   Temp (!) 97.3 F (36.3 C)   Ht 5\' 2"  (1.575 m)   Wt 188 lb (85.3 kg)   SpO2 95%   BMI 34.39 kg/m  Wt Readings from Last 3 Encounters:  07/05/23 188 lb (85.3 kg)  05/04/23 185 lb 4 oz (84 kg)  12/28/20 173 lb (78.5 kg)   Constitutional:  Well-developed, in no acute distress. Psychiatric: Normal mood and affect. Behavior is normal. HEENT: Pupils normal.  Conjunctivae are normal. No scleral icterus. Cardiovascular: Normal rate, regular rhythm. No edema Pulmonary/chest: Effort normal and breath sounds-decreased.  No wheezing, rales or rhonchi. Abdominal: Soft, nondistended. Nontender. Bowel sounds active throughout. There are no masses palpable. No  hepatomegaly. Rectal: Deferred Neurological: Alert and oriented to person place and time. Skin: Skin is warm and dry. No rashes noted.      Edman Circle, MD 07/05/2023, 7:56 AM  Cc: Galvin Proffer, MD

## 2023-07-05 NOTE — Progress Notes (Signed)
Called to room to assist during endoscopic procedure.  Patient ID and intended procedure confirmed with present staff. Received instructions for my participation in the procedure from the performing physician.  

## 2023-07-05 NOTE — Op Note (Signed)
[facility] Patient Name: Cathy Burch Procedure Date: 07/05/2023 7:59 AM MRN: [id number] Endoscopist: Lynann Bologna , MD, [id number] Age: 62 Referring MD:  Date of Birth: [birth year] Gender: Female Account #: [id number] Procedure:                Colonoscopy Indications:              Screening for colorectal malignant neoplasm Medicines:                Monitored Anesthesia Care Procedure:                Pre-Anesthesia Assessment:                           - Prior to the procedure, a History and Physical                            was performed, and patient medications and                            allergies were reviewed. The patient's tolerance of                            previous anesthesia was also reviewed. The risks                            and benefits of the procedure and the sedation                            options and risks were discussed with the patient.                            All questions were answered, and informed consent                            was obtained. Prior Anticoagulants: The patient has                            taken no anticoagulant or antiplatelet agents. ASA                            Grade Assessment: II - A patient with mild systemic                            disease. After reviewing the risks and benefits,                            the patient was deemed in satisfactory condition to                            undergo the procedure.                           After obtaining informed consent, the colonoscope  was passed under direct vision. Throughout the                            procedure, the patient's blood pressure, pulse, and                            oxygen saturations were monitored continuously. The                            Olympus Scope SN: [id number] was introduced through                            the anus and advanced to the the cecum, identified                            by appendiceal  orifice and ileocecal valve. The                            colonoscopy was performed without difficulty. The                            patient tolerated the procedure well. The quality                            of the bowel preparation was adequate to identify                            polyps. The ileocecal valve, appendiceal orifice,                            and rectum were photographed. Scope In: 8:07:26 AM Scope Out: 8:25:45 AM Scope Withdrawal Time: 0 hours 13 minutes 59 seconds  Total Procedure Duration: 0 hours 18 minutes 19 seconds  Findings:                 A 2 mm polyp was found in the cecum. The polyp was                            sessile. The polyp was removed with a cold biopsy                            forceps. Resection and retrieval were complete.                           Two sessile polyps were found in the rectum. The                            polyps were 4 to 5 mm in size. These polyps were                            removed with a cold snare. Resection and retrieval  were complete.                           There was a small lipoma, 10 mm in diameter, in the                            mid ascending colon.                           A few medium-mouthed diverticula were found in the                            sigmoid colon, transverse colon and ascending colon.                           Non-bleeding internal hemorrhoids were found during                            retroflexion. The hemorrhoids were small and Grade                            I (internal hemorrhoids that do not prolapse).                           The exam was otherwise without abnormality on                            direct and retroflexion views. Complications:            No immediate complications. Estimated Blood Loss:     Estimated blood loss: none. Impression:               - One 2 mm polyp in the cecum, removed with a cold                            biopsy  forceps. Resected and retrieved.                           - Two 4 to 5 mm polyps in the rectum, removed with                            a cold snare. Resected and retrieved.                           - Small (incidental) lipoma in the mid ascending                            colon.                           - Diverticulosis in the sigmoid colon, in the                            transverse colon and in the ascending colon.                           -  Non-bleeding internal hemorrhoids.                           - The examination was otherwise normal on direct                            and retroflexion views. Recommendation:           - Patient has a contact number available for                            emergencies. The signs and symptoms of potential                            delayed complications were discussed with the                            patient. Return to normal activities tomorrow.                            Written discharge instructions were provided to the                            patient.                           - Resume previous diet.                           - Continue present medications.                           - Await pathology results.                           - Repeat colonoscopy for surveillance based on                            pathology results.                           - The findings and recommendations were discussed                            with the patient's family. Lynann Bologna, MD 07/05/2023 8:31:17 AM This report has been signed electronically.

## 2023-07-05 NOTE — Patient Instructions (Signed)
-  Handout on polyps, hemorrhoids  nd diverticulosis provided -await pathology results -repeat colonoscopy for surveillance recommended. Date to be determined when pathology result become available   -Continue present medications   YOU HAD AN ENDOSCOPIC PROCEDURE TODAY AT THE [facility]:   Refer to the procedure report that was given to you for any specific questions about what was found during the examination.  If the procedure report does not answer your questions, please call your gastroenterologist to clarify.  If you requested that your care partner not be given the details of your procedure findings, then the procedure report has been included in a sealed envelope for you to review at your convenience later.  YOU SHOULD EXPECT: Some feelings of bloating in the abdomen. Passage of more gas than usual.  Walking can help get rid of the air that was put into your GI tract during the procedure and reduce the bloating. If you had a lower endoscopy (such as a colonoscopy or flexible sigmoidoscopy) you may notice spotting of blood in your stool or on the toilet paper. If you underwent a bowel prep for your procedure, you may not have a normal bowel movement for a few days.  Please Note:  You might notice some irritation and congestion in your nose or some drainage.  This is from the oxygen used during your procedure.  There is no need for concern and it should clear up in a day or so.  SYMPTOMS TO REPORT IMMEDIATELY:  Following lower endoscopy (colonoscopy or flexible sigmoidoscopy):  Excessive amounts of blood in the stool  Significant tenderness or worsening of abdominal pains  Swelling of the abdomen that is new, acute  Fever of 100F or higher  For urgent or emergent issues, a gastroenterologist can be reached at any hour by calling [phone]. Do not use MyChart messaging for urgent concerns.    DIET:  We do recommend a small meal at first, but then you may proceed to your  regular diet.  Drink plenty of fluids but you should avoid alcoholic beverages for 24 hours.  ACTIVITY:  You should plan to take it easy for the rest of today and you should NOT DRIVE or use heavy machinery until tomorrow (because of the sedation medicines used during the test).    FOLLOW UP: Our staff will call the number listed on your records the next business day following your procedure.  We will call around 7:15- 8:00 am to check on you and address any questions or concerns that you may have regarding the information given to you following your procedure. If we do not reach you, we will leave a message.     If any biopsies were taken you will be contacted by phone or by letter within the next 1-3 weeks.  Please call us at [phone] if you have not heard about the biopsies in 3 weeks.    SIGNATURES/CONFIDENTIALITY: You and/or your care partner have signed paperwork which will be entered into your electronic medical record.  These signatures attest to the fact that that the information above on your After Visit Summary has been reviewed and is understood.  Full responsibility of the confidentiality of this discharge information lies with you and/or your care-partner.

## 2023-07-05 NOTE — Progress Notes (Signed)
Vss nad trans to pacu 

## 2023-07-06 ENCOUNTER — Telehealth: Payer: Self-pay

## 2023-07-06 NOTE — Telephone Encounter (Signed)
Follow up call to pt, lm for pt to call if having any difficulty with normal activities or eating and drinking.  Also to call if any other questions or concerns.  

## 2023-07-07 LAB — SURGICAL PATHOLOGY

## 2023-07-09 ENCOUNTER — Encounter: Payer: Self-pay | Admitting: Gastroenterology
